# Patient Record
Sex: Male | Born: 1975 | Race: White | Hispanic: No | Marital: Married | Smoking: Never smoker
Health system: Southern US, Community
[De-identification: ages and names within clinical notes are randomized; demographics above are authoritative.]

---

## 2015-12-30 ENCOUNTER — Emergency Department (INDEPENDENT_AMBULATORY_CARE_PROVIDER_SITE_OTHER): Payer: BLUE CROSS/BLUE SHIELD

## 2015-12-30 ENCOUNTER — Encounter: Payer: Self-pay | Admitting: *Deleted

## 2015-12-30 ENCOUNTER — Emergency Department
Admission: EM | Admit: 2015-12-30 | Discharge: 2015-12-30 | Disposition: A | Discharge status: Home / Self Care | Payer: BLUE CROSS/BLUE SHIELD | Source: Home / Self Care | Attending: Family Medicine | Admitting: Family Medicine

## 2015-12-30 DIAGNOSIS — M85831 Other specified disorders of bone density and structure, right forearm: Secondary | ICD-10-CM | POA: Diagnosis not present

## 2015-12-30 DIAGNOSIS — S62101A Fracture of unspecified carpal bone, right wrist, initial encounter for closed fracture: Secondary | ICD-10-CM

## 2015-12-30 NOTE — ED Provider Notes (Signed)
[facility]    CSN: [id number] Arrival date & time: 12/30/15  [id number]     History   Chief Complaint Chief Complaint  Patient presents with  . Wrist Pain    HPI Jacob Rowe is a 40 y.o. male.   Patient reports that he fell while playing soccer one month ago, injuring his right wrist.  He complains of persistent wrist pain.   The history is provided by the patient.  Wrist Pain  This is a new problem. Episode onset: one month ago. The problem occurs constantly. The problem has not changed since onset.Exacerbated by: flexion/extension of right wrist. Nothing relieves the symptoms. Treatments tried: ibuprofen. The treatment provided no relief.    History reviewed. No pertinent past medical history.  There are no active problems to display for this patient.   History reviewed. No pertinent surgical history.     Home Medications    Prior to Admission medications   Medication Sig Start Date End Date Taking? Authorizing Provider  desloratadine-pseudoephedrine (CLARINEX-D 12-HOUR) 2.5-120 MG 12 hr tablet Take 1 tablet by mouth 2 (two) times daily.   Yes Historical Provider, MD    Family History History reviewed. No pertinent family history.  Social History Social History  Substance Use Topics  . Smoking status: Never Smoker  . Smokeless tobacco: Never Used  . Alcohol use Yes     Comment: 1-2 q wk     Allergies   Review of patient's allergies indicates no known allergies.   Review of Systems Review of Systems  All other systems reviewed and are negative.    Physical Exam Triage Vital Signs ED Triage Vitals  Enc Vitals Group     BP 12/30/15 0955 121/82     Pulse Rate 12/30/15 0955 70     Resp 12/30/15 0955 16     Temp 12/30/15 0955 98.2 F (36.8 C)     Temp Source 12/30/15 0955 Oral     SpO2 12/30/15 0955 99 %     Weight 12/30/15 0956 187 lb (84.8 kg)     Height 12/30/15 0956 5\' 9"  (1.753 m)     Head Circumference --      Peak Flow --        Pain Score 12/30/15 0958 2     Pain Loc --      Pain Edu? --      Excl. in GC? --    No data found.   Updated Vital Signs BP 121/82 (BP Location: Left Arm)   Pulse 70   Temp 98.2 F (36.8 C) (Oral)   Resp 16   Ht 5\' 9"  (1.753 m)   Wt 187 lb (84.8 kg)   SpO2 99%   BMI 27.62 kg/m   Visual Acuity Right Eye Distance:   Left Eye Distance:   Bilateral Distance:    Right Eye Near:   Left Eye Near:    Bilateral Near:     Physical Exam  Constitutional: He appears well-developed and well-nourished. No distress.  HENT:  Head: Atraumatic.  Eyes: Conjunctivae are normal. Pupils are equal, round, and reactive to light.  Cardiovascular: Normal rate.   Pulmonary/Chest: Effort normal.  Musculoskeletal:       Hands: Right wrist and proximal hand have tenderness to palpation dorsally as noted on diagram.  There is tenderness to palpation over the ulnar aspect of wrist.  Distal neurovascular function is intact.  Neurological: He is alert.  Skin: Skin is warm and dry.  Nursing  note and vitals reviewed.    UC Treatments / Results  Labs (all labs ordered are listed, but only abnormal results are displayed) Labs Reviewed - No data to display  EKG  EKG Interpretation None       Radiology Dg Wrist Complete Right  Result Date: 12/30/2015 CLINICAL DATA:  Larey SeatFell 1 month ago playing soccer. Posterior pain since then. EXAM: RIGHT WRIST - COMPLETE 3+ VIEW COMPARISON:  None. FINDINGS: Tiny bone density dorsal to the carpus likely represents an avulsion of the triquetrum. No large fracture seen. No degenerative change or other focal finding. IMPRESSION: Tiny bone density dorsal to the carpus on the lateral view likely represents a tiny avulsion fracture of the triquetrum. Electronically Signed   By: Paulina FusiMark  Shogry M.D.   On: 12/30/2015 10:01    Procedures Procedures (including critical care time)  Medications Ordered in UC Medications - No data to display   Initial Impression /  Assessment and Plan / UC Course  I have reviewed the triage vital signs and the nursing notes.  Pertinent labs & imaging results that were available during my care of the patient were reviewed by me and considered in my medical decision making (see chart for details).  Clinical Course  Suspect TFCC injury.  Applied wrist splint. Wear wrist splint daily.  May take Ibuprofen 200mg , 4 tabs every 8 hours with food as needed. Followup with Dr. Clementeen GrahamEvan Corey ([facility]) for further evaluation.     Final Clinical Impressions(s) / UC Diagnoses   Final diagnoses:  Avulsion fracture of right wrist    New Prescriptions New Prescriptions   No medications on file     Lattie HawStephen A Claiborne Stroble, MD 01/11/16 1332

## 2015-12-30 NOTE — ED Triage Notes (Signed)
Pt c/o RT wrist pain x 1 mth post fall while playing soccer. He has taken IBF intermittently without relief.

## 2015-12-30 NOTE — Discharge Instructions (Signed)
Wear wrist splint.  May take Ibuprofen 200mg , 4 tabs every 8 hours with food as needed.

## 2016-01-03 ENCOUNTER — Encounter: Payer: Self-pay | Admitting: Family Medicine

## 2016-01-03 ENCOUNTER — Ambulatory Visit (INDEPENDENT_AMBULATORY_CARE_PROVIDER_SITE_OTHER): Payer: BLUE CROSS/BLUE SHIELD | Admitting: Family Medicine

## 2016-01-03 VITALS — BP 129/85 | HR 63 | Wt 187.0 lb

## 2016-01-03 DIAGNOSIS — S6991XA Unspecified injury of right wrist, hand and finger(s), initial encounter: Secondary | ICD-10-CM

## 2016-01-03 NOTE — Progress Notes (Signed)
       Subjective:    I'm seeing this patient as a consultation for:  Dr. Donna ChristenStephen Beese  CC: R wrist pain.  HPI: 40 y.o. Male who presents for further evaluation of R wrist pain. He fell on his R wrist while playing soccer 5 weeks ago. There was no pain, swelling, or discoloration at the time. No numbness or weakness. The wrist felt uncomfortable later in the day. He has since had gradually worsening pain in the dorsal area of his right wrist that worsens when he tries to pick up heavy things. He got a wrist x-ray last week and has been wearing a brace ever since.   Past medical history, Surgical history, Family history not pertinant except as noted below, Social history, Allergies, and medications have been entered into the medical record, reviewed, and no changes needed.   Review of Systems: No headache, visual changes, nausea, vomiting, diarrhea, constipation, dizziness, abdominal pain, skin rash, fevers, chills, night sweats, weight loss, swollen lymph nodes, body aches, joint swelling, muscle aches, chest pain, shortness of breath, mood changes, visual or auditory hallucinations.   Objective:    Vitals:   01/03/16 0948  BP: 129/85  Pulse: 63   General: Well Developed, well nourished, and in no acute distress.  Neuro/Psych: Alert and oriented x3, extra-ocular muscles intact, able to move all 4 extremities Skin: no rashes noted.  Respiratory: Not using accessory muscles, speaking in full sentences  Cardiovascular: no extremity edema. Abdomen: Does not appear distended. MSK:  R wrist: No edema. Full ROM, mild tenderness in dorsal area on wrist flexion, extension, abduction, and adduction. Flexor tendons nontender to palpation. Point tenderness over dorsolateral wrist. Tenderness on finger extension.     X-ray R Wrist 12/30/15: Tiny bone density dorsal to the carpals on the lateral view likely represents a tiny avulsion  fracture of the triquetrum.  Limited musculoskeletal R wrist ultrasound 01/03/16: Hyperechoic fragment just deep to the fourth dorsal wrist compartment with increased Doppler flow likely representing avulsion fracture. The first through 6 dorsal wrist compartments were normal appearing as were the bony surfaces. No joint effusion present.  Impression and Recommendations:    Assessment and Plan: 40 y.o. male with R dorsal wrist pain on finger extension and bone fragment deep to 4th compartment, likely avulsion fracture of the triquetrum. Unclear if pain is due to ligamental injury or Radiographically occult carpal fracture. Plan for MR arthrogram of R wrist to further evaluate injury. I'm concerned that there is a ligament injury that we cannot see on x-ray. Symptoms have been present for 5 weeks and a failed to improve. Continue wearing wrist brace. Return in a few days after MRI to review findings.   Discussed warning signs or symptoms. Please see discharge instructions. Patient expresses understanding.

## 2016-01-03 NOTE — Patient Instructions (Signed)
Thank you for coming in today. Get the MRI soon.  Use the wrist brace.  Return a few days after MRI to go over findings.

## 2016-01-06 ENCOUNTER — Ambulatory Visit: Payer: BLUE CROSS/BLUE SHIELD | Admitting: Family Medicine

## 2016-01-10 ENCOUNTER — Ambulatory Visit (INDEPENDENT_AMBULATORY_CARE_PROVIDER_SITE_OTHER): Payer: BLUE CROSS/BLUE SHIELD

## 2016-01-10 ENCOUNTER — Other Ambulatory Visit: Payer: BLUE CROSS/BLUE SHIELD

## 2016-01-10 ENCOUNTER — Ambulatory Visit (INDEPENDENT_AMBULATORY_CARE_PROVIDER_SITE_OTHER): Payer: BLUE CROSS/BLUE SHIELD | Admitting: Family Medicine

## 2016-01-10 ENCOUNTER — Ambulatory Visit: Payer: BLUE CROSS/BLUE SHIELD | Admitting: Family Medicine

## 2016-01-10 DIAGNOSIS — S6991XA Unspecified injury of right wrist, hand and finger(s), initial encounter: Secondary | ICD-10-CM | POA: Diagnosis not present

## 2016-01-10 DIAGNOSIS — M778 Other enthesopathies, not elsewhere classified: Secondary | ICD-10-CM

## 2016-01-10 NOTE — Progress Notes (Signed)
  Patient presents to clinic today for previously arranged gadolinium interarticular injection.          Patient presents to clinic for  previously scheduled gadolinium interarticular contrast.  Procedure: Real-time Ultrasound Guided Injection of right wrist  Device: GE Logiq E  Images permanently stored and available for review in the ultrasound unit. Verbal informed consent obtained. Discussed risks and benefits of procedure. Warned about infection bleeding damage to structures skin hypopigmentation and fat atrophy among others. Patient expresses understanding and agreement Time-out conducted.  Noted no overlying erythema, induration, or other signs of local infection.  Skin prepped in a sterile fashion.  Local anesthesia: Topical Ethyl chloride.  With sterile technique and under real time ultrasound guidance: 3ml lidocaine 0.4605ml gadolinium contrast and 3 mL of sterile saline injected easily.  Completed without difficulty    Advised to call if fevers/chills, erythema, induration, drainage, or persistent bleeding.  Images permanently stored and available for review in the ultrasound unit.  Impression: Technically successful ultrasound guided injection.

## 2016-01-10 NOTE — Patient Instructions (Signed)
Thank you for coming in today. Return in a few days to go over MRI results.   Call or go to the ER if you develop a large red swollen joint with extreme pain or oozing puss.

## 2016-01-17 ENCOUNTER — Ambulatory Visit (INDEPENDENT_AMBULATORY_CARE_PROVIDER_SITE_OTHER): Payer: BLUE CROSS/BLUE SHIELD | Admitting: Family Medicine

## 2016-01-17 ENCOUNTER — Other Ambulatory Visit: Payer: BLUE CROSS/BLUE SHIELD

## 2016-01-17 ENCOUNTER — Encounter: Payer: Self-pay | Admitting: Family Medicine

## 2016-01-17 VITALS — BP 129/81 | HR 72 | Wt 190.0 lb

## 2016-01-17 DIAGNOSIS — S6991XA Unspecified injury of right wrist, hand and finger(s), initial encounter: Secondary | ICD-10-CM

## 2016-01-17 DIAGNOSIS — M778 Other enthesopathies, not elsewhere classified: Secondary | ICD-10-CM | POA: Diagnosis not present

## 2016-01-17 NOTE — Progress Notes (Signed)
Jacob OsierMichael Rowe is a 40 y.o. male who [facility] today for follow-up wrist pain. Patient was seen recently for wrist pain and had a MRI arthrogram of the wrist which did not show significant ligament or bone injury. He had mild scaphoid contusion, extensor carpi ulnaris tendinitis and possible strain of scapholunate ligament. He notes he's been immobilizing his wrist for a few weeks now and feels a bit better. He is interested in hand therapy if possible.   No past medical history on file. No past surgical history on file. Social History  Substance Use Topics  . Smoking status: Never Smoker  . Smokeless tobacco: Never Used  . Alcohol use Yes     Comment: 1-2 q wk     ROS:  As above   Medications: Current Outpatient Prescriptions  Medication Sig Dispense Refill  . desloratadine-pseudoephedrine (CLARINEX-D 12-HOUR) 2.5-120 MG 12 hr tablet Take 1 tablet by mouth 2 (two) times daily.     No current facility-administered medications for this visit.    No Known Allergies   Exam:  BP 129/81   Pulse 72   Wt 190 lb (86.2 kg)   BMI 28.06 kg/m  General: Well Developed, well nourished, and in no acute distress.  Neuro/Psych: Alert and oriented x3, extra-ocular muscles intact, able to move all 4 extremities, sensation grossly intact. Skin: Warm and dry, no rashes noted.  Respiratory: Not using accessory muscles, speaking in full sentences, trachea midline.  Cardiovascular: Pulses palpable, no extremity edema. Abdomen: Does not appear distended. MSK: Right wrist normal-appearing. Mildly tender to palpation overlying the extensor carpi ulnaris tendon. Mild pain with resisted wrist extension. Grip strength pulses capillary refill and sensation intact distally.   CLINICAL DATA:  Status post fall 5 weeks ago. No swelling. Pain picking up heavy objects.  EXAM: MRI OF THE RIGHT WRIST WITH CONTRAST(MR  Arthrogram)  TECHNIQUE: Multiplanar, multisequence MR imaging of the wrist was performed immediately following contrast injection into the radiocarpal joint under fluoroscopic guidance. No intravenous contrast was administered.  COMPARISON:  None.  FINDINGS: Ligaments: Mild increased signal in the volar band of the scapholunate ligament consistent with mild strain without a tear. Remainder of the scapholunate ligament is intact. Intact lunotriquetral ligament.  Triangular fibrocartilage: Intact TFCC.  Tendons: Intact flexor compartment tendons. Mild tendinosis of the extensor carpi ulnaris tendon. Remainder of the extensor compartment tendons are intact.  Carpal tunnel/median nerve: Normal carpal tunnel. Normal median nerve.  Guyon's canal: Normal.  Joint/cartilage: Intraarticular contrast in the radiocarpal joint. No contrast in the distal radioulnar joint or mid carpal joint. No chondral defect.  Bones/carpal alignment: Normal alignment. No acute fracture or dislocation. Mild marrow edema in the distal volar aspect of the scaphoid.  Other: No fluid collection or hematoma.  IMPRESSION: 1.  Mild tendinosis of the extensor carpi ulnaris tendon. 2. Mild increased signal in the volar band of the scapholunate ligament consistent with mild strain without a tear. Remainder of the scapholunate ligament is intact. 3. Mild marrow edema in the distal volar aspect of the scaphoid likely reflecting mild contusion without a fracture.   Electronically Signed   By: Elige KoHetal  Patel   On: 01/11/2016 08:04    Assessment and Plan: 40 y.o. male with wrist pain due to injury and extensor carpi ulnaris tendinitis plan for hand physical therapy and recheck in a month or 2. Return as needed.    Orders Placed This Encounter  Procedures  . Ambulatory referral to  Physical Therapy    Referral Priority:   Routine    Referral Type:   Physical Medicine    Referral Reason:    Specialty Services Required    Requested Specialty:   Physical Therapy    Number of Visits Requested:   1    Discussed warning signs or symptoms. Please see discharge instructions. Patient expresses understanding.

## 2016-01-17 NOTE — Patient Instructions (Signed)
Thank you for coming in today. Attend Hand Therapy.  Recheck in 1-2 months.    Extensor Carpi Ulnaris Tendinitis Extensor carpi ulnaris tendinitis is inflammation of the long, thin muscle that is located on the outer side of your forearm (extensor carpi ulnaris). It is a common sports-related injury. What are the causes? This condition is caused by:  Putting repeated stress on your wrist.  Using an improper technique while playing sports like golf and tennis.  Weakening of the tendon due to age or a health problem. What increases the risk? This condition is more likely to develop in people who:  Play sports like golf, tennis, or rugby.  Are middle-aged or older.  Have an inflammatory condition, such asrheumatoid arthritis. What are the signs or symptoms? Symptoms of this condition include:  Pain along your forearm when moving your wrist.  A constant ache on the pinkie side of your wrist.  Feeling like your grip is weaker than usual.  A popping or tearing feeling in your wrist.  Swelling. How is this diagnosed? This condition is diagnosed based on your symptoms, your medical history, and the results of a physical exam. During your exam, your health care provider will check the strength of your grip and may ask you to bend your wrist. Your health care provider may also order an MRI or ultrasound to check for tears in your ligaments, muscles, or tendons. How is this treated?   Treatment for this condition includes:  Resting your arm.  Wearing a splint on your wrist.  Medicines to help with pain and swelling.  Applying heat or ice to the area to ease pain.  Physical therapy to strengthen and restore range of motion in your wrist. Follow these instructions at home: If you have a splint:  Wear it as told by your health care provider. Remove it only as told by your health care provider.  Loosen the splint if your fingers become numb and tingle, or if they turn cold  and blue.  Do not let your splint get wet if it is not waterproof.  Keep the splint clean. Bathing  Do not take baths, swim, or use a hot tub until your health care provider approves. Ask your health care provider if you can take showers. You may only be allowed to take sponge baths for bathing.  If your splint is not waterproof, cover it with a watertight plastic bag when you take a bath or a shower. Managing pain, stiffness, and swelling  If directed, apply ice to the injured area.  Put ice in a plastic bag.  Place a towel between your skin and the bag.  Leave the ice on for 20 minutes, 2-3 times a day. Activity  Return to your normal activities as told by your health care provider. Ask your health care provider what activities are safe for you.  Avoid activities that put strain on your wrist for as long as told by your health care provider.  Do not use your injured hand to support your body weight until your health care provider says that you can.  Do exercises as told by your health care provider. General instructions  Do not use any tobacco products, including cigarettes, chewing tobacco, or e-cigarettes. Tobacco can delay bone healing. If you need help quitting, ask your health care provider.  Take over-the-counter and prescription medicines only as told by your health care provider.  Keep all follow-up visits as told by your health care provider. This is  important. How is this prevented?  Warm up and stretch before being active.  Cool down and stretch after being active.  Give your body time to rest between periods of activity.  Make sure to use equipment that fits you.  If you play golf or racket sports, try to improve your technique or focus on proper form.  Maintain physical fitness, including:  Strength.  Flexibility.  Endurance. Contact a health care provider if:  Your pain does not improve in 7--10 days.  Your pain gets worse. Get help right  away if:  Your pain is severe.  You cannot move your wrist. This information is not intended to replace advice given to you by your health care provider. Make sure you discuss any questions you have with your health care provider. Document Released: 02/13/2005 Document Revised: 10/21/2015 Document Reviewed: 10/30/2014 Elsevier Interactive Patient Education  2017 [employer]    Extensor [place] Ulnaris Tendinitis Rehab Ask your health care provider which exercises are safe for you. Do exercises exactly as told by your health care provider and adjust them as directed. It is normal to feel mild stretching, pulling, tightness, or discomfort as you do these exercises, but you should stop right away if you feel sudden pain or your pain gets worse. Do not begin these exercises until told by your health care provider. Stretching and range of motion exercises These exercises warm up your muscles and joints and improve the movement and flexibility of your forearm. These exercises also help to relieve pain, numbness, and tingling. Exercise A: Extensor stretch 1. Extend your __________ arm in front of you, and point your fingers downward. 2. Gently pull the palm of your __________ hand toward you until you feel a gentle stretch on the top of your forearm and wrist. 3. Hold this position for __________ seconds. 4. Slowly return to the starting position. Repeat __________ times with your elbow straight and __________ times with your elbow bent. Complete this exercises __________ times a day. Exercise B: Wrist flexor stretch 1. Stand over a tabletop with your __________ hand resting on the tabletop and your fingers pointing away from your body. Your arm should be extended, and there should be a slight bend in your elbow. 2. Gently press the back of your hand down onto the table by straightening your elbow. You should feel a stretch in the top of your forearm. 3. Hold this position for __________  seconds. 4. Slowly return to the starting position. Repeat __________ times. Complete this exercise __________ times a day. Strengthening exercises These exercises build strength and endurance in your forearm. Endurance is the ability to use your muscles for a long time, even after they get tired. Exercise C: Wrist extension 1. Sit with your __________ forearm supported on a table and your hand resting palm-down over the edge of the table. 2. Hold a __________ weight in your __________ hand. Or, hold a rubber exercise band or tube in both hands. If you are holding a band or tube, take up any slack with your other hand so there is a slight tension in the exercise band or tube when you start. 3. Slowly move the back of your hand up toward your forearm. 4. Hold this position for __________ seconds. 5. Slowly lower your hand to the starting position. Repeat __________ times. Complete this exercise __________ times a day. Exercise D: Ulnar deviation 1. Sit with your __________ forearm supported. Your thumb should be pointing upward, and your hand should be able  to move down over the table edge. 2. Hold your __________ arm in front of you and hold a rubber exercise band or tube between your hands. There should be a slight tension in the exercise band or tube when you start. 3. Move your injured wrist so your pinkie travels toward the floor. Try to only move your hand and wrist and keep the rest of your arm still. 4. Hold this position for __________ seconds. 5. Slowly return your wrist to the starting position. Repeat __________ times. Complete this exercise __________ times a day. Exercise E: Ulnar deviation, eccentric 1. Sit with your __________ forearm supported. Your thumb should be pointing upward, and your hand should be able to move down over the table edge. 2. Hold your __________ arm in front of you and hold a rubber exercise band or tube between your hands. Do not put any tension on the  exercise band or tube yet. 3. Move your __________ wrist so your pinkie travels toward the floor. 4. Add tension to the band or tube by pulling it with your __________ hand. 5. Hold this position for __________ seconds. 6. Slowly return to the starting position, controlling the speed with your __________ hand and wrist. Your hand will move toward the ceiling, thumb first. Try to move only your hand and wrist and keep the rest of your arm still. Repeat __________ times. Complete this exercise __________ times a day. This information is not intended to replace advice given to you by your health care provider. Make sure you discuss any questions you have with your health care provider. Document Released: 02/13/2005 Document Revised: 10/19/2015 Document Reviewed: 10/30/2014 Elsevier Interactive Patient Education  2017 [employer]

## 2016-10-05 ENCOUNTER — Ambulatory Visit (INDEPENDENT_AMBULATORY_CARE_PROVIDER_SITE_OTHER): Payer: BLUE CROSS/BLUE SHIELD | Admitting: Osteopathic Medicine

## 2016-10-05 ENCOUNTER — Encounter: Payer: Self-pay | Admitting: Osteopathic Medicine

## 2016-10-05 VITALS — BP 119/79 | HR 63 | Ht 69.0 in | Wt 190.0 lb

## 2016-10-05 DIAGNOSIS — Z0000 Encounter for general adult medical examination without abnormal findings: Secondary | ICD-10-CM

## 2016-10-05 DIAGNOSIS — Z23 Encounter for immunization: Secondary | ICD-10-CM

## 2016-10-05 NOTE — Progress Notes (Signed)
HPI: Jacob Rowe is a 41 y.o. male  who [facility] today, 10/05/16,  for chief complaint of:  Chief Complaint  Patient presents with  . Establish Care    ANNUAL EXAM    Patient here for annual physical / wellness exam.  See preventive care reviewed as below.  Recent labs - none available.   Additional concerns today include: none  Works at [employer]. Wife and 3 young kids.     Past medical, surgical, social and family history reviewed: Patient Active Problem List   Diagnosis Date Noted  . Right wrist tendinitis 01/17/2016  . Injury of right wrist 01/03/2016   No past surgical history on file. Social History  Substance Use Topics  . Smoking status: Never Smoker  . Smokeless tobacco: Never Used  . Alcohol use Yes     Comment: 1-2 q wk   No family history on file.   Current medication list and allergy/intolerance information reviewed:   Current Outpatient Prescriptions  Medication Sig Dispense Refill  . desloratadine-pseudoephedrine (CLARINEX-D 12-HOUR) 2.5-120 MG 12 hr tablet Take 1 tablet by mouth 2 (two) times daily.     No current facility-administered medications for this visit.    No Known Allergies    Review of Systems:  Constitutional:  No  fever, no chills, No recent illness, No unintentional weight changes. No significant fatigue.   HEENT: No  headache, no vision change, no hearing change, No sore throat, No  sinus pressure  Cardiac: No  chest pain, No  pressure, No palpitations, No  Orthopnea  Respiratory:  No  shortness of breath. No  Cough  Gastrointestinal: No  abdominal pain, No  nausea, No  vomiting,  No  blood in stool, No  diarrhea, No  constipation   Musculoskeletal: No new myalgia/arthralgia  Genitourinary: No  incontinence, No  abnormal genital bleeding, No abnormal genital discharge  Skin: No  Rash, No other wounds/concerning lesions  Hem/Onc: No  easy bruising/bleeding, No   abnormal lymph node  Endocrine: No cold intolerance,  No heat intolerance. No polyuria/polydipsia/polyphagia   Neurologic: No  weakness, No  dizziness, No  slurred speech/focal weakness/facial droop  Psychiatric: No  concerns with depression, No  concerns with anxiety, No sleep problems, No mood problems  Exam:  BP 119/79   Pulse 63   Ht 5\' 9"  (1.753 m)   Wt 190 lb (86.2 kg)   BMI 28.06 kg/m   Constitutional: VS see above. General Appearance: alert, well-developed, well-nourished, NAD  Eyes: Normal lids and conjunctive, non-icteric sclera  Ears, Nose, Mouth, Throat: MMM, Normal external inspection ears/nares/mouth/lips/gums. TM normal bilaterally. Pharynx/tonsils no erythema, no exudate. Nasal mucosa normal.   Neck: No masses, trachea midline. No thyroid enlargement. No tenderness/mass appreciated. No lymphadenopathy  Respiratory: Normal respiratory effort. no wheeze, no rhonchi, no rales  Cardiovascular: S1/S2 normal, no murmur, no rub/gallop auscultated. RRR. No lower extremity edema. Pedal pulse II/IV bilaterally DP and PT. No carotid bruit or JVD. No abdominal aortic bruit.  Gastrointestinal: Nontender, no masses. No hepatomegaly, no splenomegaly. No hernia appreciated. Bowel sounds normal. Rectal exam deferred.   Musculoskeletal: Gait normal. No clubbing/cyanosis of digits.   Neurological: Normal balance/coordination. No tremor. No cranial nerve deficit on limited exam. Motor and sensation intact and symmetric. Cerebellar reflexes intact.   Skin: warm, dry, intact. No rash/ulcer. No concerning nevi or subq nodules on limited exam.    Psychiatric: Normal judgment/insight. Normal mood and affect. Oriented x3.  ASSESSMENT/PLAN:   Annual physical exam - Plan: CBC, COMPLETE METABOLIC PANEL WITH GFR, Lipid panel, TSH  Need for tetanus, diphtheria, and acellular pertussis (Tdap) vaccine in patient of adolescent age or older - Plan: Tdap vaccine greater than or equal to 7yo  IM    MALE PREVENTIVE CARE  updated 10/05/16  ANNUAL SCREENING/COUNSELING  Any changes to health in the past year? no  Diet/Exercise - HEALTHY HABITS DISCUSSED TO DECREASE CV RISK History  Smoking Status  . Never Smoker  Smokeless Tobacco  . Never Used   History  Alcohol Use  . Yes    Comment: 1-2 q wk   Depression screen [facility] 2/9 10/05/2016  Decreased Interest 0  Down, Depressed, Hopeless 0  PHQ - 2 Score 0  Altered sleeping 0  Tired, decreased energy 0  Change in appetite 0  Feeling bad or failure about yourself  0  Trouble concentrating 0  Moving slowly or fidgety/restless 0  Suicidal thoughts 0  PHQ-9 Score 0    SEXUAL/REPRODUCTIVE HEALTH  Sexually active in the past year? - Yes with male.  STI testing needed/desired today? - no  Any concerns with testosterone/libido? - no  INFECTIOUS DISEASE SCREENING  HIV - needs - declined  GC/CT - does not need  HepC - does not need  TB - does not need  CANCER SCREENING  Lung - USPSTF: 55-80yo w/ 30 py hx unless quit w/in 72yr - does not need  Colon - does not need  Prostate - does not need  OTHER DISEASE SCREENING  Lipid - needs  DM2 - needs  AAA - 65-75yo ever smoked: does not need  Osteoporosis - men 39yo+ - does not need  ADULT VACCINATION  Influenza - annual vaccine recommended  Td - booster every 10 years   Zoster - option at 60, yes at 60+   PCV13 - was not indicated  PPSV23 - was not indicated Immunization History  Administered Date(s) Administered  . Tdap 10/05/2016    OTHER  Fall - exercise and Vit D age 35+ - does not need  Consider ASA - age 54-59 - does not need      Visit summary with medication list and pertinent instructions was printed for patient to review. All questions at time of visit were answered - patient instructed to contact office with any additional concerns. ER/RTC precautions were reviewed with the patient. Follow-up plan: Return in about 1 year  (around 10/05/2017) for annual physical - sooner if needed .

## 2016-10-06 LAB — COMPLETE METABOLIC PANEL WITH GFR
ALBUMIN: 4.4 g/dL (ref 3.6–5.1)
ALK PHOS: 61 U/L (ref 40–115)
ALT: 19 U/L (ref 9–46)
AST: 18 U/L (ref 10–40)
BILIRUBIN TOTAL: 0.6 mg/dL (ref 0.2–1.2)
BUN: 15 mg/dL (ref 7–25)
CALCIUM: 9.3 mg/dL (ref 8.6–10.3)
CO2: 25 mmol/L (ref 20–32)
CREATININE: 0.97 mg/dL (ref 0.60–1.35)
Chloride: 102 mmol/L (ref 98–110)
GFR, Est African American: >89 mL/min (ref >=60)
GFR, Est Non African American: >89 mL/min (ref >=60)
Glucose, Bld: 93 mg/dL (ref 65–99)
POTASSIUM: 4.5 mmol/L (ref 3.5–5.3)
Sodium: 138 mmol/L (ref 135–146)
TOTAL PROTEIN: 6.7 g/dL (ref 6.1–8.1)

## 2016-10-06 LAB — LIPID PANEL
CHOLESTEROL: 165 mg/dL (ref <200)
HDL: 46 mg/dL (ref >40)
LDL Cholesterol: 101 mg/dL — ABNORMAL HIGH (ref <100)
Total CHOL/HDL Ratio: 3.6 Ratio (ref <5.0)
Triglycerides: 88 mg/dL (ref <150)
VLDL: 18 mg/dL (ref <30)

## 2016-10-06 LAB — CBC
HEMATOCRIT: 48.9 % (ref 38.5–50.0)
HEMOGLOBIN: 16.8 g/dL (ref 13.2–17.1)
MCH: 30.8 pg (ref 27.0–33.0)
MCHC: 34.4 g/dL (ref 32.0–36.0)
MCV: 89.6 fL (ref 80.0–100.0)
MPV: 9.2 fL (ref 7.5–12.5)
Platelets: 323 10*3/uL (ref 140–400)
RBC: 5.46 MIL/uL (ref 4.20–5.80)
RDW: 13.1 % (ref 11.0–15.0)
WBC: 5.9 10*3/uL (ref 3.8–10.8)

## 2016-10-06 LAB — TSH: TSH: 1.19 mIU/L (ref 0.40–4.50)

## 2016-10-09 NOTE — Progress Notes (Signed)
Your labs look good - normal kidney function - cholesterol in a healthy range - normal blood counts - no evidence of diabetes - no evidence of thyroid disease 

## 2017-03-07 IMAGING — MR MR WRIST*R* W/ CM
5 series · 40 of 40 positions shown · IV contrast (agent unspecified)
Comparison: None.

CLINICAL DATA: Status post fall 5 weeks ago. No swelling. Pain
picking up heavy objects.

EXAM:
MRI OF THE RIGHT WRIST WITH CONTRAST(MR Arthrogram)
TECHNIQUE: Multiplanar, multisequence MR imaging of the wrist was performed
immediately following contrast injection into the radiocarpal joint
under fluoroscopic guidance. No intravenous contrast was
administered.

[Series 4: T2 fat-sat · axial · 3.0mm · 0.62mm/px · z∈[-48,+29]mm · 9 of 25 slices shown (1 of 2)]
[im 1/25]
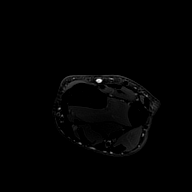
[im 4/25]
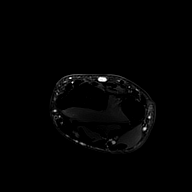
[im 7/25]
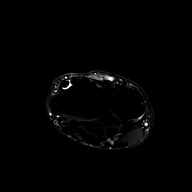
[im 10/25]
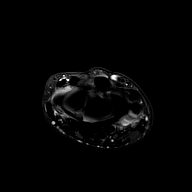
[im 13/25]
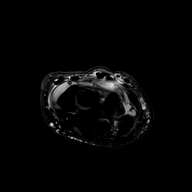
[im 16/25]
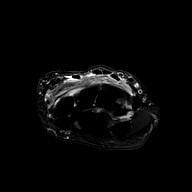
[im 19/25]
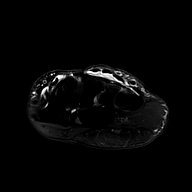
[im 22/25]
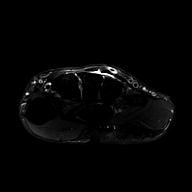
[im 25/25]
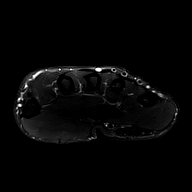

[Series 5: T1 fat-sat · coronal · 3.0mm · 0.31mm/px · 7 of 19 slices shown]
[im 1/19]
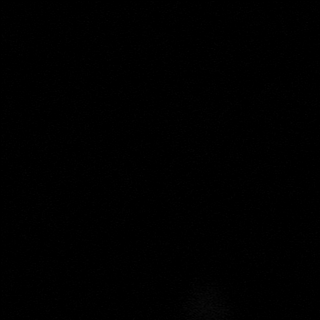
[im 4/19]
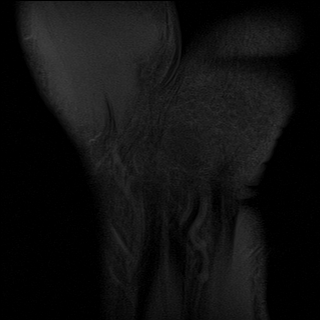
[im 7/19]
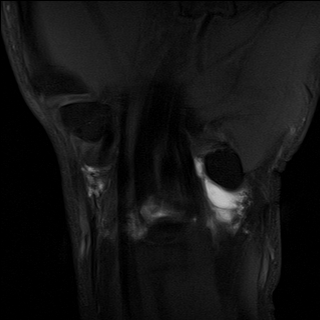
[im 10/19]
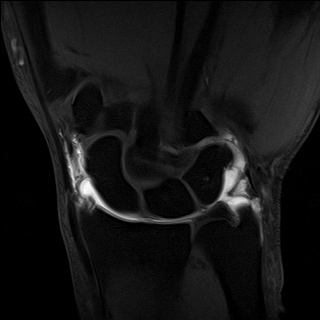
[im 13/19]
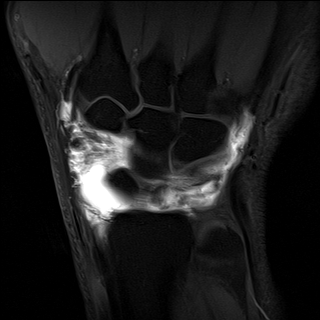
[im 16/19]
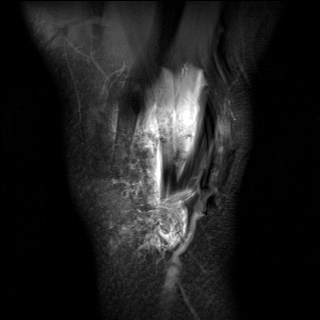
[im 19/19]
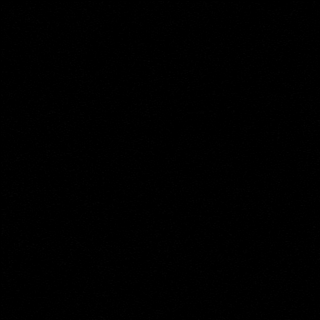

[Series 6: T1 · coronal · 3.0mm · 0.31mm/px · 7 of 19 slices shown]
[im 1/19]
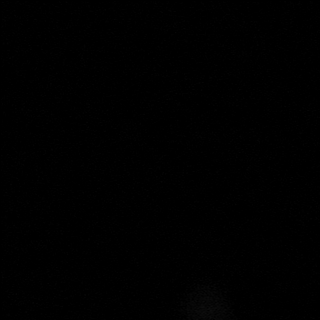
[im 4/19]
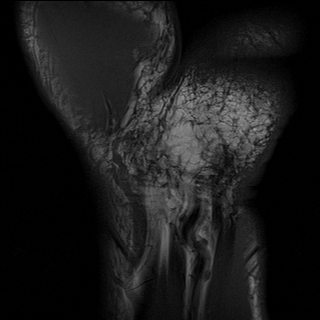
[im 7/19]
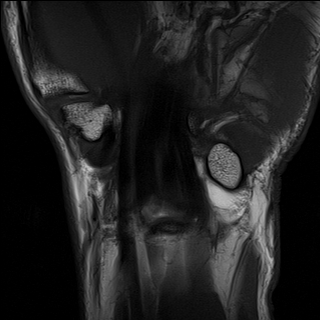
[im 10/19]
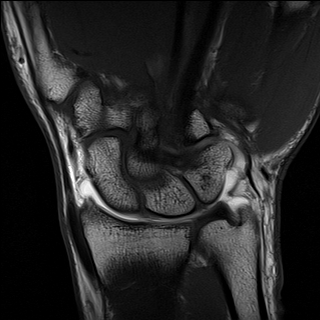
[im 13/19]
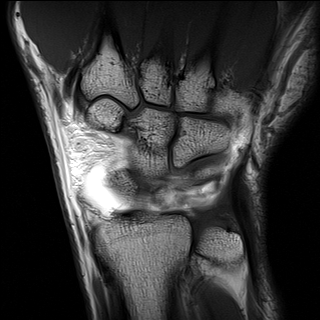
[im 16/19]
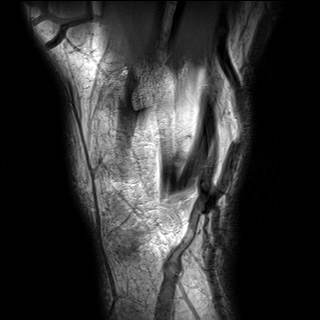
[im 19/19]
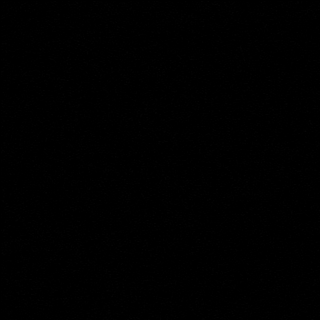

[Series 7: T2 fat-sat · coronal · 3.0mm · 0.39mm/px · 7 of 19 slices shown (2 of 2)]
[im 1/19]
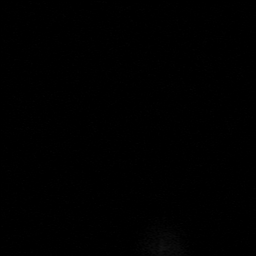
[im 4/19]
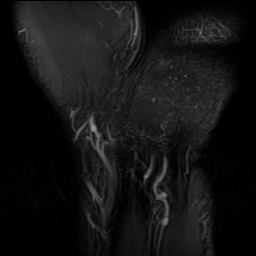
[im 7/19]
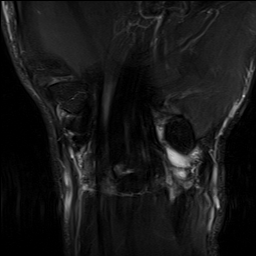
[im 10/19]
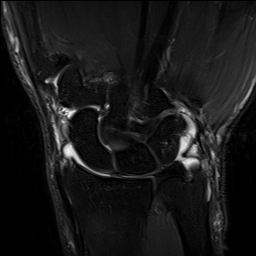
[im 13/19]
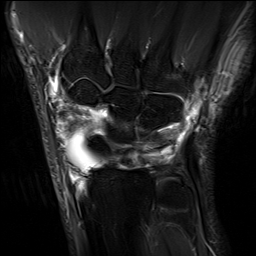
[im 16/19]
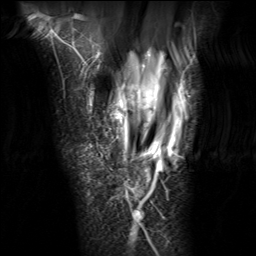
[im 19/19]
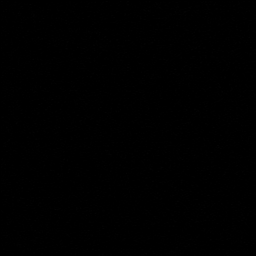

[Series 8: (id) w/fatsat · sagittal · 3.0mm · 0.31mm/px · 10 of 27 slices shown]
[im 1/27]
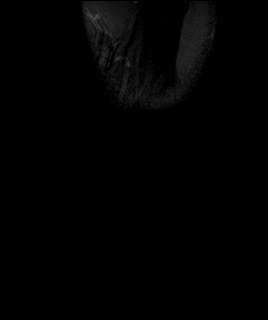
[im 3/27]
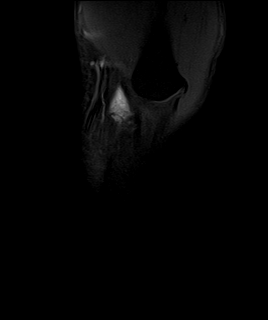
[im 6/27]
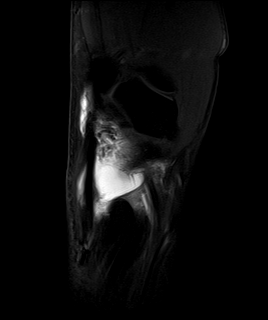
[im 9/27]
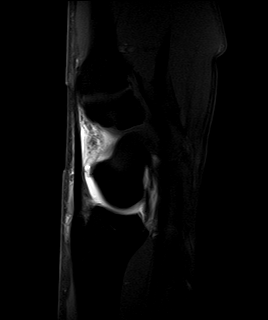
[im 12/27]
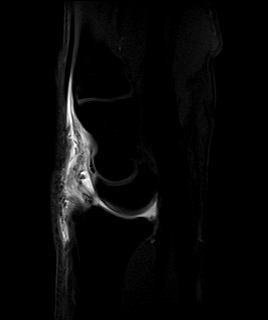
[im 15/27]
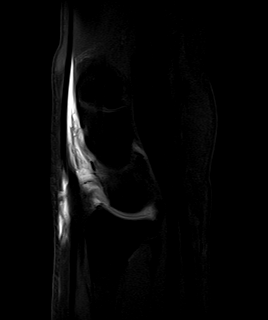
[im 18/27]
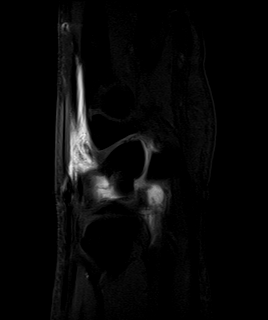
[im 21/27]
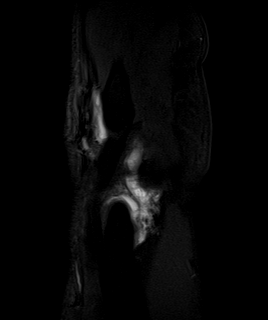
[im 24/27]
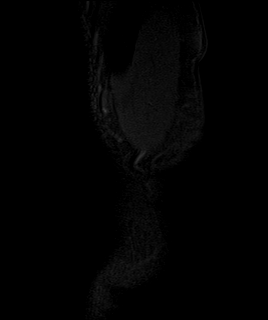
[im 27/27]
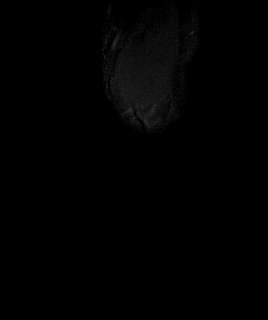

[40 of 40 positions shown; findings below may reference images not displayed]

FINDINGS: Ligaments: Mild increased signal in the volar band of the
scapholunate ligament consistent with mild strain without a tear.
Remainder of the scapholunate ligament is intact. Intact
lunotriquetral ligament.

Triangular fibrocartilage: Intact TFCC.

Tendons: Intact flexor compartment tendons. Mild tendinosis of the
extensor carpi ulnaris tendon. Remainder of the extensor compartment
tendons are intact.

Carpal tunnel/median nerve: Normal carpal tunnel. Normal median
nerve.

Guyon's canal: Normal.

Joint/cartilage: Intraarticular contrast in the radiocarpal joint.
No contrast in the distal radioulnar joint or mid carpal joint. No
chondral defect.

Bones/carpal alignment: Normal alignment. No acute fracture or
dislocation. Mild marrow edema in the distal volar aspect of the
scaphoid.

Other: No fluid collection or hematoma.
IMPRESSION: 1.  Mild tendinosis of the extensor carpi ulnaris tendon.
2. Mild increased signal in the volar band of the scapholunate
ligament consistent with mild strain without a tear. Remainder of
the scapholunate ligament is intact.
3. Mild marrow edema in the distal volar aspect of the scaphoid
likely reflecting mild contusion without a fracture.

## 2019-06-28 DIAGNOSIS — U071 COVID-19: Secondary | ICD-10-CM

## 2019-06-28 HISTORY — DX: COVID-19: U07.1

## 2019-07-08 DIAGNOSIS — U071 COVID-19: Secondary | ICD-10-CM | POA: Insufficient documentation

## 2020-05-20 ENCOUNTER — Encounter: Payer: Self-pay | Admitting: Emergency Medicine

## 2020-05-20 ENCOUNTER — Emergency Department: Admit: 2020-05-20 | Discharge status: Absent without leave | Payer: Self-pay

## 2020-05-20 ENCOUNTER — Other Ambulatory Visit: Payer: Self-pay

## 2020-05-20 ENCOUNTER — Emergency Department
Admission: EM | Admit: 2020-05-20 | Discharge: 2020-05-20 | Disposition: A | Discharge status: Home / Self Care | Payer: PRIVATE HEALTH INSURANCE | Source: Home / Self Care

## 2020-05-20 DIAGNOSIS — L239 Allergic contact dermatitis, unspecified cause: Secondary | ICD-10-CM | POA: Diagnosis not present

## 2020-05-20 MED ORDER — DEXAMETHASONE SODIUM PHOSPHATE 10 MG/ML IJ SOLN
10.0000 mg | Freq: Once | INTRAMUSCULAR | Status: AC
  Administered 2020-05-20: 10 mg via INTRAMUSCULAR

## 2020-05-20 MED ORDER — PREDNISONE 10 MG (21) PO TBPK: ORAL_TABLET | Freq: Every day | ORAL | 0 refills | Status: AC

## 2020-05-20 MED ORDER — TRIAMCINOLONE ACETONIDE 0.1 % EX CREA: 1.0000 "application " | TOPICAL_CREAM | Freq: Two times a day (BID) | CUTANEOUS | 0 refills | Status: AC

## 2020-05-20 NOTE — ED Triage Notes (Addendum)
Poison ivy to bilat arms after cutting up a tree last Saturday  Usually resolves on its own after a couple of days OTC calamine  Rash to arms, face, ankles, eye lids  COVID 06/2019

## 2020-05-20 NOTE — Discharge Instructions (Addendum)
You have received a steroid injection in the office today  I have sent in a prednisone taper for you to take for 6 days. 6 tablets on day one, 5 tablets on day two, 4 tablets on day three, 3 tablets on day four, 2 tablets on day five, and 1 tablet on day six.  I have sent in triamcinolone cream for you to use to the area twice a day as needed.  Follow up with this office or with primary care if symptoms are persisting.  Follow up in the ER for high fever, trouble swallowing, trouble breathing, other concerning symptoms.

## 2020-05-20 NOTE — ED Provider Notes (Signed)
[facility]   [id number] 05/20/20 Arrival Time: 0827  CC: RASH  SUBJECTIVE:  Jacob Rowe is a 45 y.o. male who presents with a skin complaint that began 6 days ago. Reports that he was outside cutting down a tree and was exposed to poison ivy. Localizes the rash to bilateral upper and lower extremities as well as his forehead. Describes it as red, raised and itchy. Has tried calamine lotion without relief. There are no aggravating or alleviating factors. Reports similar symptoms in the past. Denies fever, chills, nausea, vomiting, discharge, oral lesions, SOB, chest pain, abdominal pain, changes in bowel or bladder function.    ROS: As per HPI.  All other pertinent ROS negative.     Past Medical History:  Diagnosis Date  . COVID-19 06/2019   History reviewed. No pertinent surgical history. No Known Allergies No current facility-administered medications on file prior to encounter.   Current Outpatient Medications on File Prior to Encounter  Medication Sig Dispense Refill  . desloratadine-pseudoephedrine (CLARINEX-D 12-HOUR) 2.5-120 MG 12 hr tablet Take 1 tablet by mouth 2 (two) times daily. (Patient not taking: Reported on 05/20/2020)     Social History   Socioeconomic History  . Marital status: Married    Spouse name: Not on file  . Number of children: Not on file  . Years of education: Not on file  . Highest education level: Not on file  Occupational History  . Not on file  Tobacco Use  . Smoking status: Never Smoker  . Smokeless tobacco: Never Used  Vaping Use  . Vaping Use: Never used  Substance and Sexual Activity  . Alcohol use: Yes    Comment: 1-2 q wk  . Drug use: No  . Sexual activity: Not on file  Other Topics Concern  . Not on file  Social History Narrative  . Not on file   Social Determinants of Health   Financial Resource Strain: Not on file  Food Insecurity: Not on file  Transportation Needs: Not on file  Physical Activity: Not on file   Stress: Not on file  Social Connections: Not on file  Intimate Partner Violence: Not on file   Family History  Problem Relation Age of Onset  . Healthy Mother   . Healthy Father   . Healthy Sister   . Healthy Brother     OBJECTIVE: Vitals:   05/20/20 0845 05/20/20 0849  BP: 113/76   Pulse: 66   Resp: 15   Temp: 98.7 F (37.1 C)   TempSrc: Oral   SpO2: 97%   Weight:  184 lb (83.5 kg)  Height:  5\' 9"  (1.753 m)    General appearance: alert; no distress Head: NCAT Lungs: clear to auscultation bilaterally Heart: regular rate and rhythm.  Radial pulse 2+ bilaterally Extremities: no edema Skin: warm and dry; erythematous, raised, vesicular clusters of rash to bilateral upper and lower extremities, forehead Psychological: alert and cooperative; normal mood and affect  ASSESSMENT & PLAN:  1. Allergic contact dermatitis, unspecified trigger     Meds ordered this encounter  Medications  . dexamethasone (DECADRON) injection 10 mg  . predniSONE (STERAPRED UNI-PAK 21 TAB) 10 MG (21) TBPK tablet    Sig: Take by mouth daily for 6 days. Take 6 tablets on day 1, 5 tablets on day 2, 4 tablets on day 3, 3 tablets on day 4, 2 tablets on day 5, 1 tablet on day 6    Dispense:  21 tablet    Refill:  0    Order Specific Question:   Supervising Provider    Answer:   Merrilee Jansky [id number]  . triamcinolone (KENALOG) 0.1 %    Sig: Apply 1 application topically 2 (two) times daily.    Dispense:  45 g    Refill:  0    Order Specific Question:   Supervising Provider    Answer:   Merrilee Jansky [[id number]]   Decadron 10mg  IM in office  Prednisone taper prescribed Triamcinolone prescribed Take as prescribed and to completion Avoid hot showers/ baths Moisturize skin daily  Follow up with PCP if symptoms persists Return or go to the ER if you have any new or worsening symptoms such as fever, chills, nausea, vomiting, redness, swelling, discharge, if symptoms do not improve with  medications  Reviewed expectations re: course of current medical issues. Questions answered. Outlined signs and symptoms indicating need for more acute intervention. Patient verbalized understanding. After Visit Summary given.   , NP 05/20/20 [phone]
